# Patient Record
Sex: Male | Born: 2005 | Race: White | Hispanic: No | Marital: Single | Smoking: Never smoker
Health system: Southern US, Community
[De-identification: ages and names within clinical notes are randomized; demographics above are authoritative.]

## PROBLEM LIST (undated history)

## (undated) DIAGNOSIS — L309 Dermatitis, unspecified: Secondary | ICD-10-CM

## (undated) DIAGNOSIS — J45909 Unspecified asthma, uncomplicated: Secondary | ICD-10-CM

## (undated) DIAGNOSIS — F909 Attention-deficit hyperactivity disorder, unspecified type: Secondary | ICD-10-CM

## (undated) HISTORY — DX: Attention-deficit hyperactivity disorder, unspecified type: F90.9

## (undated) HISTORY — DX: Dermatitis, unspecified: L30.9

## (undated) HISTORY — DX: Unspecified asthma, uncomplicated: J45.909

---
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
## 2024-01-26 ENCOUNTER — Encounter: Payer: Self-pay | Admitting: Family Medicine

## 2024-01-26 ENCOUNTER — Ambulatory Visit (INDEPENDENT_AMBULATORY_CARE_PROVIDER_SITE_OTHER): Admitting: Family Medicine

## 2024-01-26 VITALS — BP 130/80 | HR 78 | Temp 97.8°F | Resp 18 | Ht 68.7 in | Wt 238.0 lb

## 2024-01-26 DIAGNOSIS — J3081 Allergic rhinitis due to animal (cat) (dog) hair and dander: Secondary | ICD-10-CM | POA: Insufficient documentation

## 2024-01-26 DIAGNOSIS — J301 Allergic rhinitis due to pollen: Secondary | ICD-10-CM

## 2024-01-26 DIAGNOSIS — Z136 Encounter for screening for cardiovascular disorders: Secondary | ICD-10-CM

## 2024-01-26 DIAGNOSIS — L209 Atopic dermatitis, unspecified: Secondary | ICD-10-CM | POA: Insufficient documentation

## 2024-01-26 DIAGNOSIS — R41840 Attention and concentration deficit: Secondary | ICD-10-CM

## 2024-01-26 DIAGNOSIS — J309 Allergic rhinitis, unspecified: Secondary | ICD-10-CM | POA: Insufficient documentation

## 2024-01-26 DIAGNOSIS — Z114 Encounter for screening for human immunodeficiency virus [HIV]: Secondary | ICD-10-CM

## 2024-01-26 DIAGNOSIS — B353 Tinea pedis: Secondary | ICD-10-CM

## 2024-01-26 DIAGNOSIS — L2084 Intrinsic (allergic) eczema: Secondary | ICD-10-CM | POA: Diagnosis not present

## 2024-01-26 DIAGNOSIS — Z1159 Encounter for screening for other viral diseases: Secondary | ICD-10-CM

## 2024-01-26 DIAGNOSIS — J4530 Mild persistent asthma, uncomplicated: Secondary | ICD-10-CM

## 2024-01-26 DIAGNOSIS — Z130 Encounter for screening for diseases of the blood and blood-forming organs and certain disorders involving the immune mechanism: Secondary | ICD-10-CM

## 2024-01-26 MED ORDER — TRIAMCINOLONE ACETONIDE 0.5 % EX OINT: 1.0000 | TOPICAL_OINTMENT | Freq: Two times a day (BID) | CUTANEOUS | 3 refills | Status: AC

## 2024-01-26 MED ORDER — KETOCONAZOLE 2 % EX CREA: TOPICAL_CREAM | Freq: Two times a day (BID) | CUTANEOUS | 1 refills | Status: AC

## 2024-01-26 NOTE — Patient Instructions (Signed)
  VISIT SUMMARY: Jonathan Villanueva, a 18 year old male with a history of atopic dermatitis, asthma, and allergic rhinitis, visited to establish care and address concerns about his eczema. He has been experiencing recurrent eczema on his calves and feet, and he is interested in a stronger treatment. He also mentioned occasional attention difficulties and his ongoing management of asthma and allergic rhinitis.  YOUR PLAN: -ATOPIC DERMATITIS (ECZEMA): Eczema is a condition that makes your skin red, inflamed, and itchy. It can be triggered by various factors, including heat and dry skin. You will start using triamcinolone  0.5% ointment twice daily on the affected areas of your calves and feet. You should stop using desonide and ketoconazole  unless you need them for a fungal infection.  -TINEA PEDIS (ATHLETE'S FOOT): Athlete's foot is a fungal infection that usually starts between the toes but can also affect other parts of the feet. Although your symptoms are not typical for athlete's foot, sweating of the feet could contribute to this condition. Keep an eye on your symptoms and let us  know if they worsen or change.  -ATTENTION DIFFICULTIES: You mentioned having occasional difficulties with attention and completing tasks. We will provide you with questionnaires to help evaluate these issues further. Please complete them and bring them to your next appointment so we can discuss the results and decide on any further steps.  INSTRUCTIONS: Please schedule a follow-up appointment to discuss the results of the attention difficulties questionnaires. Continue using your asthma and allergy medications as prescribed by your specialist. If you notice any changes or worsening of your symptoms, contact our office.

## 2024-01-26 NOTE — Progress Notes (Signed)
 Assessment & Plan   Assessment/Plan:    Assessment & Plan Atopic dermatitis (eczema) involving calves and feet Chronic eczema primarily affecting the front of the calves and tops of the feet, persistent for a few months. Previous treatment with triamcinolone  cream 0.1% was used, but a stronger formulation is recommended. Differential diagnosis includes eczema and possibly tinea pedis, but the presentation is more consistent with eczema. - Prescribe triamcinolone  0.5% ointment to be applied twice daily to affected areas on the calves and tops of the feet. - Discontinue desonide and ketoconazole  unless needed for antifungal treatment.  Tinea pedis (athlete's foot) Possible tinea pedis. Reports sweating of the feet, which could contribute to the condition. The presentation is not classic for tinea pedis, as it is more on the tops of the feet rather than between the toes.  Attention difficulties (evaluation planned) Reports occasional difficulties with attention and task completion, though not as severe as during childhood. Further evaluation is needed to assess the extent of attention difficulties. - Provide questionnaires for further evaluation of attention difficulties. - Schedule follow-up appointment to discuss questionnaire results and potential further evaluation.   No SI HI   Medications Discontinued During This Encounter  Medication Reason   triamcinolone  cream (KENALOG ) 0.1 %    fluticasone (FLONASE ALLERGY RELIEF) 50 MCG/ACT nasal spray    ketoconazole  (NIZORAL ) 2 % cream Reorder   desonide (DESOWEN) 0.05 % ointment     Return in about 2 weeks (around 02/09/2024) for fasting labs, attention.        Subjective:   Encounter date: 01/26/2024  Jonathan Villanueva is a 18 y.o. male who has Allergic rhinitis; Allergic rhinitis due to animal (cat) (dog) hair and dander; Allergic rhinitis due to pollen; Atopic dermatitis; Mild persistent asthma, uncomplicated; and Myopia of both  eyes on their problem list..   He  has a past medical history of ADHD, Asthma, and Eczema.SABRA   He presents with chief complaint of Establish Care and Rash (Pt c/o of eczema on right and left calfs for a few months; currently using RX cream that was prescribed  ) .   Discussed the use of AI scribe software for clinical note transcription with the patient, who gave verbal consent to proceed.  History of Present Illness Jonathan Villanueva is a 18 year old male with atopic dermatitis, asthma, and allergic rhinitis who presents to establish care and address eczema concerns.  He has recurrent eczema on the right and left calves, present for a few months. He uses triamcinolone  0.1% cream but is interested in a stronger formulation. He previously used desonide 0.05% ointment. He also has a rash on the tops of his feet and has not used ketoconazole  2% cream recently. The eczema is located on the front of his calves and may be exacerbated by working in high heat environments, leading to dry skin. He notes pigmentation changes and scabbing from previous eczema flares.  He has a history of asthma, managed with a Flovent 44 mcg inhaler, two puffs twice daily, and albuterol as needed. He continues to follow up with an asthma and allergy specialist who prescribes his asthma medications and conducts regular breathing tests and allergy assessments. No current shortness of breath, but allergies worsen after mowing the grass.  He has seasonal allergic rhinitis, for which he uses cetirizine 10 mg tablets and montelukast 5 mg chewable tablets. He previously tried fluticasone nasal spray but found it ineffective due to sneezing it out.  He was in school for aviation  maintenance but has not completed his testing due to financial constraints. He is currently working part-time jobs, including at an [employer] and previously at [employer]. His feet sweat a lot at work, which may contribute to skin issues.      01/26/2024     3:59 PM  Depression screen PHQ 2/9  Decreased Interest 1  Down, Depressed, Hopeless 2  PHQ - 2 Score 3  Altered sleeping 2  Tired, decreased energy 1  Change in appetite 0  Feeling bad or failure about yourself  3  Trouble concentrating 1  Moving slowly or fidgety/restless 1  Suicidal thoughts 0  PHQ-9 Score 11  Difficult doing work/chores Not difficult at all      01/26/2024    4:00 PM  GAD 7 : Generalized Anxiety Score  Nervous, Anxious, on Edge 0  Control/stop worrying 1  Worry too much - different things 0  Trouble relaxing 0  Restless 2  Easily annoyed or irritable 1  Afraid - awful might happen 1  Total GAD 7 Score 5  Anxiety Difficulty Not difficult at all    Adult ADHD Self Report Scale (most recent)     Adult ADHD Self-Report Scale (ASRS-v1.1) Symptom Checklist   No documentation.         ROS  History reviewed. No pertinent surgical history.  Outpatient Medications Prior to Visit  Medication Sig Dispense Refill   albuterol (PROVENTIL HFA;VENTOLIN HFA) 108 (90 BASE) MCG/ACT inhaler Inhale 2 puffs into the lungs every 6 (six) hours as needed.       cetirizine (ZYRTEC) 10 MG tablet 1 tablet     fluticasone (FLOVENT HFA) 44 MCG/ACT inhaler 2 puffs     montelukast (SINGULAIR) 5 MG chewable tablet 1 tablet in the evening     desonide (DESOWEN) 0.05 % ointment      ketoconazole  (NIZORAL ) 2 % cream      triamcinolone  cream (KENALOG ) 0.1 % 1 application to affected area     fluticasone (FLONASE ALLERGY RELIEF) 50 MCG/ACT nasal spray 1 spray in each nostril (Patient not taking: Reported on 01/26/2024)     No facility-administered medications prior to visit.    Family History  Problem Relation Age of Onset   Carpal tunnel syndrome Mother    Hypertension Father     Social History   Socioeconomic History   Marital status: Single    Spouse name: Not on file   Number of children: Not on file   Years of education: Not on file   Highest education level: Not  on file  Occupational History   Not on file  Tobacco Use   Smoking status: Never   Smokeless tobacco: Never  Vaping Use   Vaping status: Never Used  Substance and Sexual Activity   Alcohol use: Never   Drug use: Never   Sexual activity: Yes    Birth control/protection: None  Other Topics Concern   Not on file  Social History Narrative   Not on file   Social Drivers of Health   Financial Resource Strain: Not on file  Food Insecurity: Not on file  Transportation Needs: Not on file  Physical Activity: Not on file  Stress: Not on file  Social Connections: Not on file  Intimate Partner Violence: Not on file  Objective:  Physical Exam: BP 130/80 (BP Location: Left Arm, Patient Position: Sitting, Cuff Size: Large)   Pulse 78   Temp 97.8 F (36.6 C) (Temporal)   Resp 18   Ht 5' 8.7 (1.745 m)   Wt 238 lb (108 kg)   SpO2 98%   BMI 35.45 kg/m    Physical Exam GENERAL: Alert, cooperative, well developed, no acute distress. HEENT: Normocephalic, normal oropharynx, moist mucous membranes. CHEST: Clear to auscultation bilaterally, no wheezes, rhonchi, or crackles. CARDIOVASCULAR: Normal heart rate and rhythm, S1 and S2 normal without murmurs. ABDOMEN: Soft, non-tender, non-distended, without organomegaly, normal bowel sounds. EXTREMITIES: No cyanosis or edema. NEUROLOGICAL: Cranial nerves grossly intact, moves all extremities without gross motor or sensory deficit. SKIN: Dry scaling skin on shins bilaterally, erythema scaling dorsal foot, erythema noted between the webs of multiple toes on the left foot   Physical Exam  No results found.  No results found for this or any previous visit (from the past 2160 hours).      Beverley Adine Hummer, MD, MS

## 2024-02-09 ENCOUNTER — Other Ambulatory Visit (INDEPENDENT_AMBULATORY_CARE_PROVIDER_SITE_OTHER)

## 2024-02-09 DIAGNOSIS — Z1321 Encounter for screening for nutritional disorder: Secondary | ICD-10-CM | POA: Diagnosis not present

## 2024-02-09 DIAGNOSIS — R41840 Attention and concentration deficit: Secondary | ICD-10-CM

## 2024-02-09 DIAGNOSIS — Z130 Encounter for screening for diseases of the blood and blood-forming organs and certain disorders involving the immune mechanism: Secondary | ICD-10-CM

## 2024-02-09 DIAGNOSIS — Z136 Encounter for screening for cardiovascular disorders: Secondary | ICD-10-CM | POA: Diagnosis not present

## 2024-02-09 DIAGNOSIS — Z13228 Encounter for screening for other metabolic disorders: Secondary | ICD-10-CM | POA: Diagnosis not present

## 2024-02-09 DIAGNOSIS — Z1329 Encounter for screening for other suspected endocrine disorder: Secondary | ICD-10-CM

## 2024-02-09 DIAGNOSIS — Z114 Encounter for screening for human immunodeficiency virus [HIV]: Secondary | ICD-10-CM | POA: Diagnosis not present

## 2024-02-09 DIAGNOSIS — Z1159 Encounter for screening for other viral diseases: Secondary | ICD-10-CM

## 2024-02-09 LAB — MICROALBUMIN / CREATININE URINE RATIO
Creatinine,U: 175.3 mg/dL
Microalb Creat Ratio: UNDETERMINED mg/g (ref 0.0–30.0)
Microalb, Ur: <0.7 mg/dL

## 2024-02-09 NOTE — Addendum Note (Signed)
 Addended by: ALTO PARODY D on: 02/09/2024 04:10 PM   Modules accepted: Orders

## 2024-02-09 NOTE — Addendum Note (Signed)
 Addended by: SEBASTIAN BEVERLEY NOVAK on: 02/09/2024 08:31 AM   Modules accepted: Orders

## 2024-02-09 NOTE — Addendum Note (Signed)
 Addended by: ALTO PARODY D on: 02/09/2024 04:09 PM   Modules accepted: Orders

## 2024-02-10 LAB — LIPID PANEL
Cholesterol: 249 mg/dL — ABNORMAL HIGH (ref 0–200)
HDL: 37.5 mg/dL — ABNORMAL LOW (ref >39.00)
LDL Cholesterol: 186 mg/dL — ABNORMAL HIGH (ref 0–99)
NonHDL: 211.11
Total CHOL/HDL Ratio: 7
Triglycerides: 127 mg/dL (ref 0.0–149.0)
VLDL: 25.4 mg/dL (ref 0.0–40.0)

## 2024-02-10 LAB — COMPREHENSIVE METABOLIC PANEL WITH GFR
ALT: 34 U/L (ref 0–53)
AST: 29 U/L (ref 0–37)
Albumin: 5.1 g/dL (ref 3.5–5.2)
Alkaline Phosphatase: 91 U/L (ref 39–117)
BUN: 11 mg/dL (ref 6–23)
CO2: 22 meq/L (ref 19–32)
Calcium: 10 mg/dL (ref 8.4–10.5)
Chloride: 101 meq/L (ref 96–112)
Creatinine, Ser: 1 mg/dL (ref 0.40–1.50)
GFR: 108.65 mL/min (ref >60.00)
Glucose, Bld: 70 mg/dL (ref 70–99)
Potassium: 4.1 meq/L (ref 3.5–5.1)
Sodium: 135 meq/L (ref 135–145)
Total Bilirubin: 0.4 mg/dL (ref 0.2–1.2)
Total Protein: 8.2 g/dL (ref 6.0–8.3)

## 2024-02-10 LAB — HCV INTERPRETATION

## 2024-02-10 LAB — HCV AB W REFLEX TO QUANT PCR: HCV Ab: NONREACTIVE

## 2024-02-10 LAB — TSH: TSH: 2.19 u[IU]/mL (ref 0.35–5.50)

## 2024-02-14 LAB — HEPATITIS B SURFACE ANTIBODY,QUALITATIVE: Hep B S Ab: NONREACTIVE

## 2024-02-14 LAB — VITAMIN D 1,25 DIHYDROXY
Vitamin D 1, 25 (OH)2 Total: 45 pg/mL (ref 18–72)
Vitamin D2 1, 25 (OH)2: <8 pg/mL
Vitamin D3 1, 25 (OH)2: 45 pg/mL

## 2024-02-14 LAB — URINALYSIS W MICROSCOPIC + REFLEX CULTURE
Bacteria, UA: NONE SEEN /HPF
Bilirubin Urine: NEGATIVE
Glucose, UA: NEGATIVE
Hgb urine dipstick: NEGATIVE
Hyaline Cast: NONE SEEN /LPF
Ketones, ur: NEGATIVE
Leukocyte Esterase: NEGATIVE
Nitrites, Initial: NEGATIVE
Protein, ur: NEGATIVE
RBC / HPF: NONE SEEN /HPF (ref 0–2)
Specific Gravity, Urine: 1.021 (ref 1.001–1.035)
Squamous Epithelial / HPF: NONE SEEN /HPF (ref <=5)
WBC, UA: NONE SEEN /HPF (ref 0–5)
pH: 7.5 (ref 5.0–8.0)

## 2024-02-14 LAB — HIV ANTIBODY (ROUTINE TESTING W REFLEX)
HIV 1&2 Ab, 4th Generation: NONREACTIVE
HIV FINAL INTERPRETATION: NEGATIVE

## 2024-02-14 LAB — HEPATITIS B SURFACE ANTIGEN: Hepatitis B Surface Ag: NONREACTIVE

## 2024-02-14 LAB — NO CULTURE INDICATED

## 2024-02-14 LAB — HEPATITIS B CORE ANTIBODY, TOTAL: Hep B Core Total Ab: NONREACTIVE

## 2024-02-15 ENCOUNTER — Ambulatory Visit: Payer: Self-pay | Admitting: Family Medicine

## 2024-02-15 DIAGNOSIS — Z23 Encounter for immunization: Secondary | ICD-10-CM | POA: Insufficient documentation

## 2024-02-15 DIAGNOSIS — E782 Mixed hyperlipidemia: Secondary | ICD-10-CM | POA: Insufficient documentation

## 2024-02-25 ENCOUNTER — Telehealth: Payer: Self-pay | Admitting: Family Medicine

## 2024-02-25 NOTE — Telephone Encounter (Signed)
 I think pt is wanting a cb concerning his lab results.

## 2024-02-25 NOTE — Telephone Encounter (Signed)
 Copied from CRM [id number]. Topic: General - Call Back - No Documentation >> Feb 25, 2024  3:21 PM Drema MATSU wrote: Reason for CRM: Patient is returning a call from clinic. He stated that Rorie gave him a call.

## 2024-02-25 NOTE — Telephone Encounter (Unsigned)
 Copied from CRM [id number]. Topic: General - Call Back - No Documentation >> Feb 25, 2024  3:21 PM Drema MATSU wrote: Reason for CRM: Patient is returning a call from clinic. He stated that Rorie gave him a call.

## 2024-02-28 NOTE — Telephone Encounter (Signed)
 Spoke to patient and went over results and plan. Pt verbalized understanding; appointment were made NV and lab.

## 2024-02-29 ENCOUNTER — Other Ambulatory Visit

## 2024-02-29 ENCOUNTER — Ambulatory Visit: Payer: Self-pay | Admitting: Family Medicine

## 2024-02-29 DIAGNOSIS — E782 Mixed hyperlipidemia: Secondary | ICD-10-CM

## 2024-02-29 LAB — LIPID PANEL
Cholesterol: 211 mg/dL — ABNORMAL HIGH (ref 0–200)
HDL: 38.6 mg/dL — ABNORMAL LOW (ref >39.00)
LDL Cholesterol: 154 mg/dL — ABNORMAL HIGH (ref 0–99)
NonHDL: 172.88
Total CHOL/HDL Ratio: 5
Triglycerides: 94 mg/dL (ref 0.0–149.0)
VLDL: 18.8 mg/dL (ref 0.0–40.0)

## 2024-03-01 ENCOUNTER — Ambulatory Visit (INDEPENDENT_AMBULATORY_CARE_PROVIDER_SITE_OTHER)

## 2024-03-01 DIAGNOSIS — Z23 Encounter for immunization: Secondary | ICD-10-CM | POA: Diagnosis not present

## 2024-03-01 LAB — CBC WITH DIFFERENTIAL/PLATELET
Basophils Absolute: 0.1 K/uL (ref 0.0–0.1)
Basophils Relative: 0.8 % (ref 0.0–3.0)
Eosinophils Absolute: 0.4 K/uL (ref 0.0–0.7)
Eosinophils Relative: 4.8 % (ref 0.0–5.0)
HCT: 42.7 % (ref 39.0–52.0)
Hemoglobin: 14.4 g/dL (ref 13.0–17.0)
Lymphocytes Relative: 35.8 % (ref 12.0–46.0)
Lymphs Abs: 2.9 K/uL (ref 0.7–4.0)
MCHC: 33.6 g/dL (ref 30.0–36.0)
MCV: 81.6 fl (ref 78.0–100.0)
Monocytes Absolute: 0.6 K/uL (ref 0.1–1.0)
Monocytes Relative: 7.5 % (ref 3.0–12.0)
Neutro Abs: 4.2 K/uL (ref 1.4–7.7)
Neutrophils Relative %: 51.1 % (ref 43.0–77.0)
Platelets: 390 K/uL (ref 150.0–400.0)
RBC: 5.24 Mil/uL (ref 4.22–5.81)
RDW: 13.6 % (ref 11.5–14.6)
WBC: 8.2 K/uL (ref 4.5–10.5)

## 2024-03-01 LAB — HEMOGLOBIN A1C: Hgb A1c MFr Bld: 5.7 % (ref 4.6–6.5)

## 2024-03-01 NOTE — Progress Notes (Signed)
 Patient came into clinic for nurse visit for HEP-B vaccine. Administered in Left deltoid, patient tolerated well.

## 2024-04-04 ENCOUNTER — Ambulatory Visit (INDEPENDENT_AMBULATORY_CARE_PROVIDER_SITE_OTHER)

## 2024-04-04 DIAGNOSIS — Z23 Encounter for immunization: Secondary | ICD-10-CM | POA: Diagnosis not present

## 2024-04-04 NOTE — Progress Notes (Signed)
 After obtaining consent, and per orders of Dr. Sebastian, injection of Heplisav (HepB immunization) given in left delt by Armenta Check, CMA. Patient tolerated injection well. Series complete.
